# Patient Record
Sex: Male | Born: 1965 | Race: White | Hispanic: No | Marital: Single
Health system: Southern US, Community
[De-identification: ages and names within clinical notes are randomized; demographics above are authoritative.]

## PROBLEM LIST (undated history)

## (undated) DIAGNOSIS — R011 Cardiac murmur, unspecified: Secondary | ICD-10-CM

## (undated) DIAGNOSIS — B1920 Unspecified viral hepatitis C without hepatic coma: Secondary | ICD-10-CM

---

## 2017-06-14 ENCOUNTER — Encounter (HOSPITAL_COMMUNITY): Payer: Self-pay | Admitting: Emergency Medicine

## 2017-06-14 ENCOUNTER — Emergency Department (HOSPITAL_COMMUNITY)
Admission: EM | Admit: 2017-06-14 | Discharge: 2017-06-15 | Disposition: A | Discharge status: Home / Self Care | Payer: Self-pay | Attending: Emergency Medicine | Admitting: Emergency Medicine

## 2017-06-14 DIAGNOSIS — F1410 Cocaine abuse, uncomplicated: Secondary | ICD-10-CM | POA: Insufficient documentation

## 2017-06-14 DIAGNOSIS — R0789 Other chest pain: Secondary | ICD-10-CM | POA: Insufficient documentation

## 2017-06-14 HISTORY — DX: Cardiac murmur, unspecified: R01.1

## 2017-06-14 HISTORY — DX: Unspecified viral hepatitis C without hepatic coma: B19.20

## 2017-06-14 NOTE — ED Triage Notes (Signed)
Per EMS: Pt c/o of chronic CP.  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  Pt has admitted to having ETOH on board.  Pt c/o of back pain, dizziness, and nausea.  Pt CBG was 69, given of D10 and new CBG is 200.  Pt c/o of nausea and dizziness has subsided. Pt given 2 of nitro and has not helped CP.    VSS EKG A&Ox4

## 2017-06-14 NOTE — ED Provider Notes (Signed)
[facility]-EMERGENCY DEPT Provider Note   CSN: [id number] Arrival date & time: 06/14/17  2334  By signing my name below, I, [name], attest that this documentation has been prepared under the direction and in the presence of Leana Springston, Jeannett SeniorStephen, MD. Electronically Signed: [name], ED Scribe. 06/14/17. 11:54 PM.  LEVEL V CAVEAT: HPI and ROS limited due to ETOH intoxication.  History   Chief Complaint Chief Complaint  Patient presents with  . Chest Pain    HPI Burton ApleyJohnny Salas is a 51 y.o. male with a PMHx of seizures, heart murmur, ETOH abuse, who presents to the Emergency Department complaining of constant chest pain that started ~ 2 hours ago. Associated sx include SOB, chills and diaphoresis. Pt was in the car when onset started. Over exertion exacerbates his pain. He has had similar pain in the past. No hx of heart attacks. He drank 4 beers PTA. Pt is not currently taking any medication because he can't afford them. Pt denies dizziness.   Additionally he c/o of intermittent chronic back pain. Pain radiates down his right leg. His pain was exacerbated after moving a mattress.   The history is provided by the patient. The history is limited by the condition of the patient. No language interpreter was used.    Past Medical History:  Diagnosis Date  . Hepatitis C   . Murmur, cardiac     There are no active problems to display for this patient.   No past surgical history on file.     Home Medications    Prior to Admission medications   Not on File    Family History No family history on file.  Social History Social History  Substance Use Topics  . Smoking status: Not on file  . Smokeless tobacco: Not on file  . Alcohol use Not on file     Allergies   Patient has no allergy information on record.   Review of Systems Review of Systems  Unable to perform ROS: Other (ETOH intoxication)   Physical Exam Updated Vital Signs BP 121/78 (BP Location: Right  Arm)   Pulse 92   Temp 98.1 F (36.7 C) (Oral)   Resp 13   Ht 5\' 4"  (1.626 m)   Wt 138 lb (62.6 kg)   SpO2 95%   BMI 23.69 kg/m   Physical Exam  Constitutional: He is oriented to person, place, and time. He appears well-developed and well-nourished. No distress.  Disheveled appearance.  intoxicated  HENT:  Head: Normocephalic and atraumatic.  Mouth/Throat: Oropharynx is clear and moist. No oropharyngeal exudate.  Poor dentition.  Eyes: Pupils are equal, round, and reactive to light. Conjunctivae and EOM are normal.  Neck: Normal range of motion. Neck supple.  No meningismus.  Cardiovascular: Normal rate, regular rhythm, normal heart sounds and intact distal pulses.   No murmur heard. Equal femoral and DP pulses  Pulmonary/Chest: Effort normal and breath sounds normal. No respiratory distress.  Reproducible chest pain. Right paraspinal thoracic tenderness. No midline tenderness.   Abdominal: Soft. There is no tenderness. There is no rebound and no guarding.  Musculoskeletal: Normal range of motion. He exhibits no edema or tenderness.  5/5 strength in bilateral lower extremities. Ankle plantar and dorsiflexion intact. Great toe extension intact bilaterally. +2 DP and PT pulses. +2 patellar reflexes bilaterally. Normal gait.  Neurological: He is alert and oriented to person, place, and time. No cranial nerve deficit. He exhibits normal muscle tone. Coordination normal.   5/5 strength throughout. CN 2-12  intact.Equal grip strength.   Skin: Skin is warm.  Psychiatric: He has a normal mood and affect. His behavior is normal.  Nursing note and vitals reviewed.    ED Treatments / Results  DIAGNOSTIC STUDIES: Oxygen Saturation is 95% on RA, normal by my interpretation.   Labs (all labs ordered are listed, but only abnormal results are displayed) Labs Reviewed  CBC WITH DIFFERENTIAL/PLATELET - Abnormal; Notable for the following:       Result Value   Hemoglobin 17.1 (*)     Lymphs Abs 4.7 (*)    All other components within normal limits  COMPREHENSIVE METABOLIC PANEL - Abnormal; Notable for the following:    CO2 21 (*)    Calcium 8.4 (*)    AST 73 (*)    All other components within normal limits  ETHANOL - Abnormal; Notable for the following:    Alcohol, Ethyl (B) 286 (*)    All other components within normal limits  RAPID URINE DRUG SCREEN, HOSP PERFORMED - Abnormal; Notable for the following:    Cocaine POSITIVE (*)    Benzodiazepines POSITIVE (*)    All other components within normal limits  D-DIMER, QUANTITATIVE (NOT AT [facility]) - Abnormal; Notable for the following:    D-Dimer, Quant 0.95 (*)    All other components within normal limits  URINALYSIS, ROUTINE W REFLEX MICROSCOPIC - Abnormal; Notable for the following:    Color, Urine STRAW (*)    Specific Gravity, Urine 1.003 (*)    All other components within normal limits  TROPONIN I  TROPONIN I    EKG  EKG Interpretation  Date/Time:  Thursday June 14 2017 23:37:04 EDT Ventricular Rate:  88 PR Interval:    QRS Duration: 82 QT Interval:  372 QTC Calculation: 451 R Axis:   69 Text Interpretation:  Sinus rhythm No previous ECGs available Confirmed by Glynn Octave [phone]) on 06/14/2017 11:43:11 PM       Radiology Dg Chest 2 View  Result Date: 06/15/2017 CLINICAL DATA:  Chronic chest pain EXAM: CHEST  2 VIEW COMPARISON:  None. FINDINGS: The heart size and mediastinal contours are within normal limits. Both lungs are clear. The visualized skeletal structures are unremarkable. IMPRESSION: No active cardiopulmonary disease. Electronically Signed   By: Tollie Eth M.D.   On: 06/15/2017 01:16   Ct Angio Chest/abd/pel For Dissection W And/or Wo Contrast  Result Date: 06/15/2017 CLINICAL DATA:  51 year old male with left-sided chest pain radiating to the back. Concern for aortic dissection. EXAM: CT ANGIOGRAPHY CHEST, ABDOMEN AND PELVIS TECHNIQUE: Multidetector CT imaging through the chest,  abdomen and pelvis was performed using the standard protocol during bolus administration of intravenous contrast. Multiplanar reconstructed images and MIPs were obtained and reviewed to evaluate the vascular anatomy. CONTRAST:  100 cc Isovue 370 COMPARISON:  Chest radiograph dated 06/15/2017 FINDINGS: CTA CHEST FINDINGS Cardiovascular: There is no cardiomegaly or pericardial effusion. The thoracic aorta appears unremarkable. There is no aneurysmal dilatation or evidence of dissection. The origins of the great vessels of the aortic arch appear patent. There is no CT evidence of pulmonary embolism. Mediastinum/Nodes: No hilar or mediastinal adenopathy. The esophagus and the thyroid gland are grossly unremarkable. No mediastinal hematoma or fluid collection. Lungs/Pleura: Mild centrilobular emphysema. The lungs are clear. There is no pleural effusion or pneumothorax. There is a 4 mm right upper lobe subpleural nodule (series 7, image 84). The central airways are patent. Musculoskeletal: No chest wall abnormality. No acute or significant osseous findings. Review of the  MIP images confirms the above findings. CTA ABDOMEN AND PELVIS FINDINGS VASCULAR Aorta: Moderate atherosclerotic calcification. No aneurysmal dilatation or dissection. Mild ectasia of the infrarenal aorta measuring up to 2.1 cm in diameter. Celiac: There is focal narrowing of the origin of the celiac axis, likely secondary to compression by diaphragmatic crus. The celiac axis is otherwise patent. No aneurysm or dissection. SMA: Patent without evidence of aneurysm, dissection, vasculitis or significant stenosis. Renals: Both renal arteries are patent without evidence of aneurysm, dissection, vasculitis, fibromuscular dysplasia or significant stenosis. IMA: Patent without evidence of aneurysm, dissection, vasculitis or significant stenosis. Inflow: Moderate atherosclerotic calcification of the iliac arteries. No aneurysmal dilatation or dissection. Veins:  No obvious venous abnormality within the limitations of this arterial phase study. Review of the MIP images confirms the above findings. NON-VASCULAR No intra-abdominal free air or free fluid. Hepatobiliary: Mild diffuse fatty infiltration of the liver. There is slight irregularity of the hepatic contour concerning for early cirrhosis. Correlation with clinical exam and liver function tests recommended. Small amount of layering small stones within the gallbladder versus volume averaging artifact with adjacent vessel (series 6, image 186). There is no pericholecystic fluid or evidence of gallbladder inflammation by CT. Pancreas: Unremarkable. No pancreatic ductal dilatation or surrounding inflammatory changes. Spleen: Normal in size without focal abnormality. Adrenals/Urinary Tract: The adrenal glands, kidneys, and the visualized ureters appear unremarkable. The urinary bladder is distended and grossly unremarkable. Stomach/Bowel: Moderate amount of stool throughout the colon. No evidence of bowel obstruction or active inflammation. The visualized appendix or appendiceal stump appears unremarkable. No inflammatory changes in the right lower quadrant or pericecal region. Lymphatic: No adenopathy. Reproductive: The prostate and seminal vesicles are grossly unremarkable. Other: None Musculoskeletal: No acute or significant osseous findings. Review of the MIP images confirms the above findings. IMPRESSION: 1. No aortic aneurysm or dissection. No CT evidence of pulmonary artery embolus. 2.  Aortic Atherosclerosis (ICD10-I70.0). 3. 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 The celiac axis remains patent. 4. Mild centrilobular emphysema.  No focal consolidation. 5. A 4 mm right upper lobe subpleural nodule. No follow-up needed if patient is low-risk. Non-contrast chest CT can be considered in 12 months if patient is high-risk. This recommendation follows the  consensus statement: Guidelines for Management of Incidental Pulmonary Nodules Detected on CT Images: From the Fleischner Society 2017; Radiology 2017; 284:228-243. 6. Fatty liver. Electronically Signed   By: Elgie Collard M.D.   On: 06/15/2017 02:58    Procedures Procedures (including critical care time)  Medications Ordered in ED Medications - No data to display   Initial Impression / Assessment and Plan / ED Course  I have reviewed the triage vital signs and the nursing notes.  Pertinent labs & imaging results that were available during my care of the patient were reviewed by me and considered in my medical decision making (see chart for details).    Patient presents with left-sided chest pain onset a few hours ago while he was driving. Patient endorses drinking alcohol tonight. And has a history of hepatitis C. Denies any IV drug abuse.  EKG is normal sinus rhythm. There is no comparison. Troponin is negative. Chest pain is somewhat reproducible to palpation. Drug screen is positive for cocaine. Patient states he last used this 2 days ago. 3:13 AM Pt states he used cocaine a couple of days ago with a friend. He states he doesn't normally use cocaine.    CTA negative for  dissection or other acute pathology.  Patient states back pain is ongoing issue. No evidence of cord compression or cauda equina.  Troponin negative x2.  Chest pain atypical for ACS. Doubt cocaine induced vasospasm as last use was >48 hours ago.   Patient stable for outpatient followup. Alcohol and cocaine cessation discussed. Return precautions discussed. Final Clinical Impressions(s) / ED Diagnoses   Final diagnoses:  Atypical chest pain  Cocaine abuse    New Prescriptions New Prescriptions   No medications on file   I personally performed the services described in this documentation, which was scribed in my presence. The recorded information has been reviewed and is accurate.     Glynn Octave,  MD 06/15/17 2108

## 2017-06-15 ENCOUNTER — Encounter (HOSPITAL_COMMUNITY): Payer: Self-pay | Admitting: Radiology

## 2017-06-15 ENCOUNTER — Emergency Department (HOSPITAL_COMMUNITY): Payer: Self-pay

## 2017-06-15 LAB — CBC WITH DIFFERENTIAL/PLATELET
BASOS PCT: 0 %
Basophils Absolute: 0 10*3/uL (ref 0.0–0.1)
EOS ABS: 0.2 10*3/uL (ref 0.0–0.7)
Eosinophils Relative: 2 %
HEMATOCRIT: 49.7 % (ref 39.0–52.0)
Hemoglobin: 17.1 g/dL — ABNORMAL HIGH (ref 13.0–17.0)
Lymphocytes Relative: 53 %
Lymphs Abs: 4.7 10*3/uL — ABNORMAL HIGH (ref 0.7–4.0)
MCH: 32.9 pg (ref 26.0–34.0)
MCHC: 34.4 g/dL (ref 30.0–36.0)
MCV: 95.6 fL (ref 78.0–100.0)
MONO ABS: 0.5 10*3/uL (ref 0.1–1.0)
MONOS PCT: 5 %
Neutro Abs: 3.7 10*3/uL (ref 1.7–7.7)
Neutrophils Relative %: 40 %
Platelets: 157 10*3/uL (ref 150–400)
RBC: 5.2 MIL/uL (ref 4.22–5.81)
RDW: 14.7 % (ref 11.5–15.5)
WBC: 9.1 10*3/uL (ref 4.0–10.5)

## 2017-06-15 LAB — RAPID URINE DRUG SCREEN, HOSP PERFORMED
Amphetamines: NOT DETECTED
BARBITURATES: NOT DETECTED
BENZODIAZEPINES: POSITIVE — AB
COCAINE: POSITIVE — AB
OPIATES: NOT DETECTED
Tetrahydrocannabinol: NOT DETECTED

## 2017-06-15 LAB — COMPREHENSIVE METABOLIC PANEL
ALBUMIN: 3.9 g/dL (ref 3.5–5.0)
ALT: 41 U/L (ref 17–63)
ANION GAP: 13 (ref 5–15)
AST: 73 U/L — ABNORMAL HIGH (ref 15–41)
Alkaline Phosphatase: 71 U/L (ref 38–126)
BILIRUBIN TOTAL: 0.6 mg/dL (ref 0.3–1.2)
BUN: 7 mg/dL (ref 6–20)
CO2: 21 mmol/L — ABNORMAL LOW (ref 22–32)
Calcium: 8.4 mg/dL — ABNORMAL LOW (ref 8.9–10.3)
Chloride: 109 mmol/L (ref 101–111)
Creatinine, Ser: 0.83 mg/dL (ref 0.61–1.24)
GFR calc non Af Amer: >60 mL/min (ref >60)
GLUCOSE: 83 mg/dL (ref 65–99)
POTASSIUM: 4.1 mmol/L (ref 3.5–5.1)
SODIUM: 143 mmol/L (ref 135–145)
TOTAL PROTEIN: 8 g/dL (ref 6.5–8.1)

## 2017-06-15 LAB — TROPONIN I
Troponin I: <0.03 ng/mL (ref <0.03)
Troponin I: <0.03 ng/mL (ref <0.03)

## 2017-06-15 LAB — URINALYSIS, ROUTINE W REFLEX MICROSCOPIC
BILIRUBIN URINE: NEGATIVE
Glucose, UA: NEGATIVE mg/dL
Hgb urine dipstick: NEGATIVE
Ketones, ur: NEGATIVE mg/dL
LEUKOCYTES UA: NEGATIVE
NITRITE: NEGATIVE
PH: 5 (ref 5.0–8.0)
Protein, ur: NEGATIVE mg/dL
SPECIFIC GRAVITY, URINE: 1.003 — AB (ref 1.005–1.030)

## 2017-06-15 LAB — ETHANOL: Alcohol, Ethyl (B): 286 mg/dL — ABNORMAL HIGH (ref <5)

## 2017-06-15 LAB — D-DIMER, QUANTITATIVE: D-Dimer, Quant: 0.95 ug/mL-FEU — ABNORMAL HIGH (ref 0.00–0.50)

## 2017-06-15 MED ORDER — IOPAMIDOL (ISOVUE-370) INJECTION 76%
INTRAVENOUS | Status: AC
  Administered 2017-06-15: 100 mL
  Filled 2017-06-15: qty 100

## 2017-06-15 MED ORDER — KETOROLAC TROMETHAMINE 30 MG/ML IJ SOLN
30.0000 mg | Freq: Once | INTRAMUSCULAR | Status: AC
Start: 2017-06-15 — End: 2017-06-15
  Administered 2017-06-15: 30 mg via INTRAVENOUS
  Filled 2017-06-15: qty 1

## 2017-06-15 MED ORDER — LORAZEPAM 2 MG/ML IJ SOLN
1.0000 mg | Freq: Once | INTRAMUSCULAR | Status: AC
  Administered 2017-06-15: 1 mg via INTRAVENOUS
  Filled 2017-06-15: qty 1

## 2017-06-15 NOTE — ED Notes (Signed)
ED Provider at bedside. 

## 2017-06-15 NOTE — ED Notes (Signed)
Pt provided sandwich at discharge, stated he had not had anything to eat all night

## 2017-06-15 NOTE — Discharge Instructions (Signed)
There is no evidence of heart attack or blood clot in the lung. Stop using cocaine. Follow up with your doctor. Return to the ED if you develop new or worsening symptoms.

## 2018-03-29 IMAGING — DX DG CHEST 2V
2 series · 3 of 3 positions shown · non-contrast
Comparison: None.

CLINICAL DATA: Chronic chest pain

EXAM:
CHEST  2 VIEW

[Series 2: chest lat · 0.14mm/px · 2 of 2 slices shown]
[im 1/2]
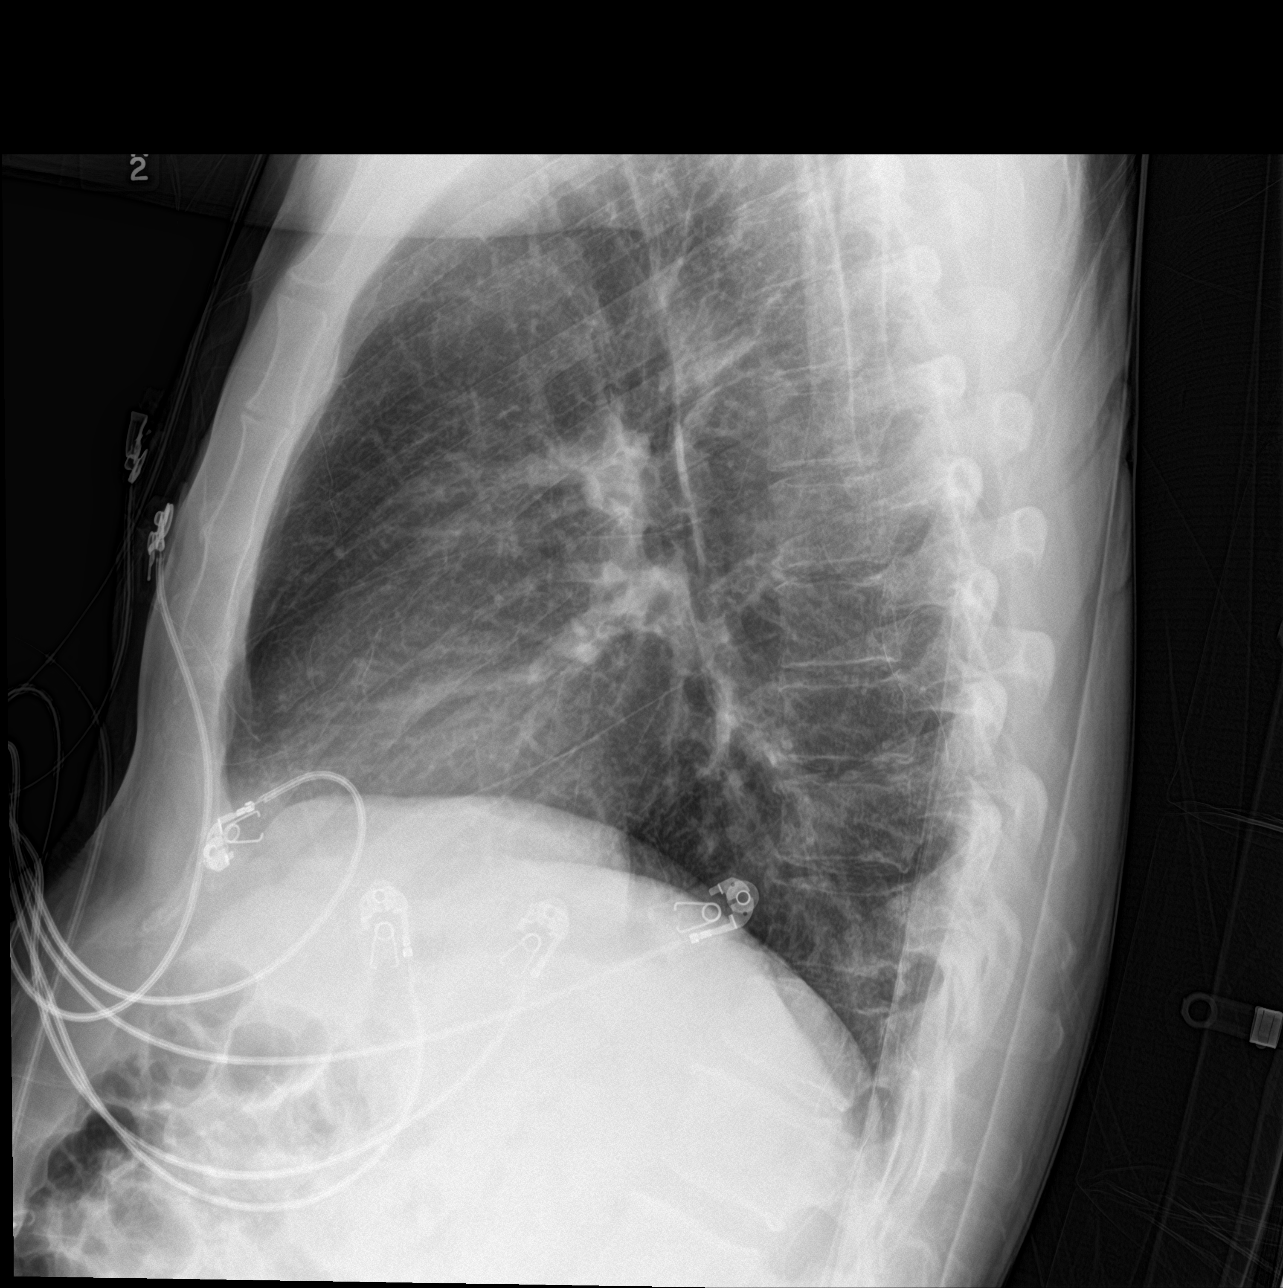
[im 2/2]
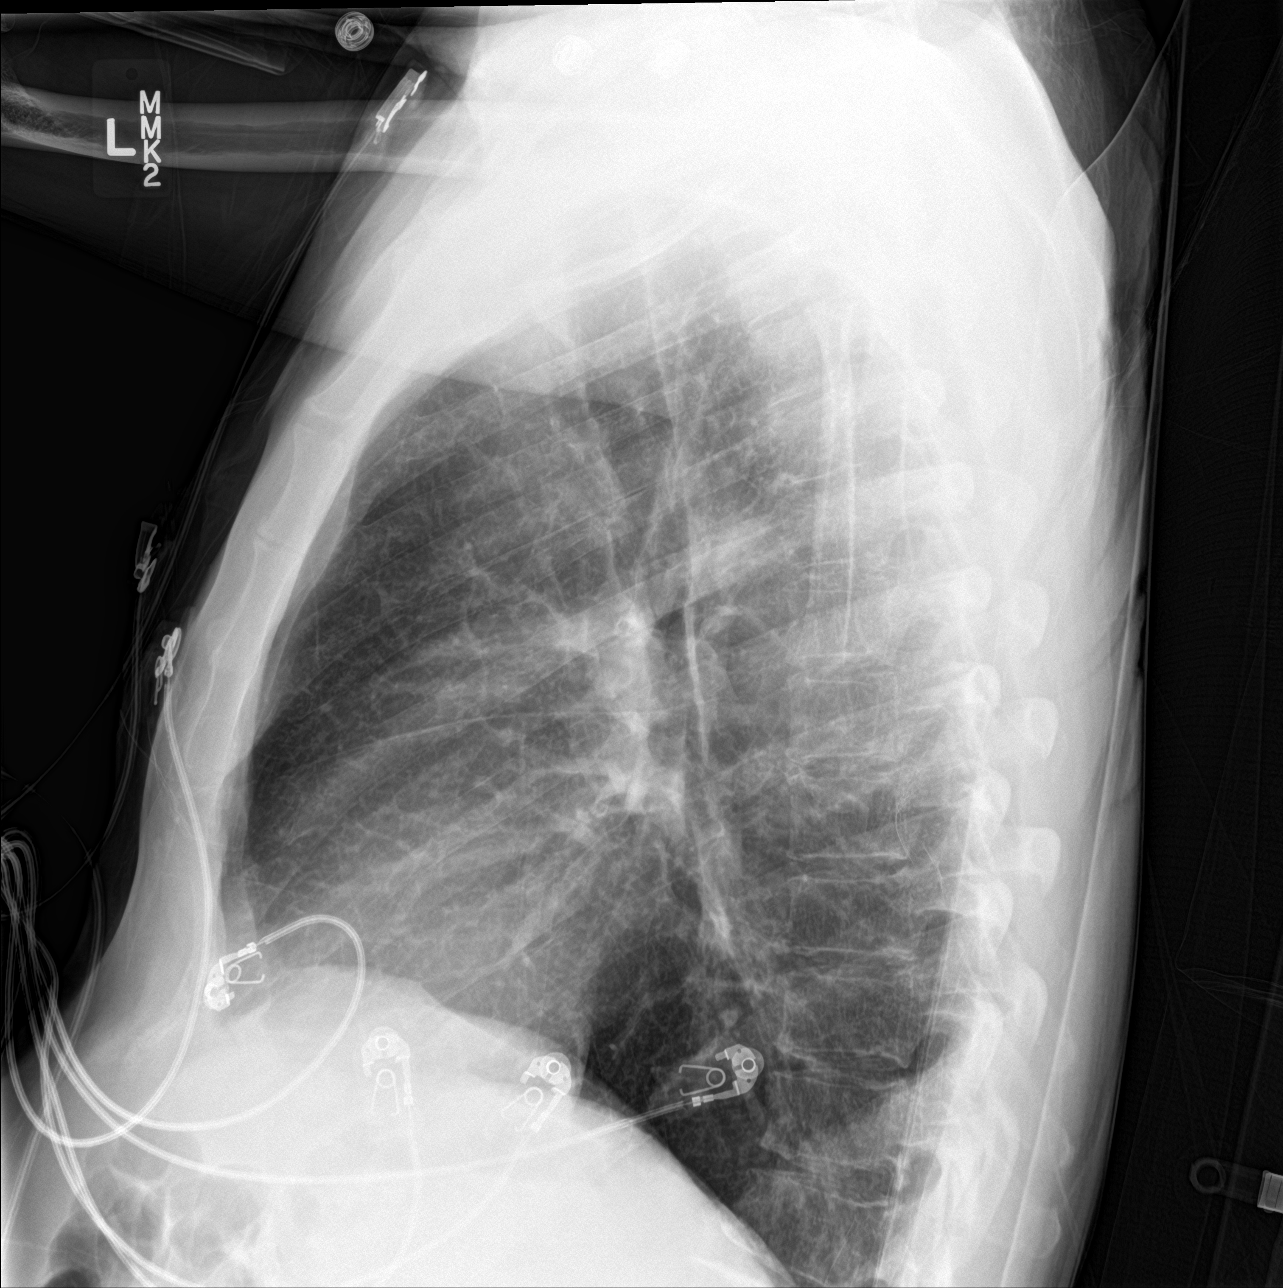

[chest ap]
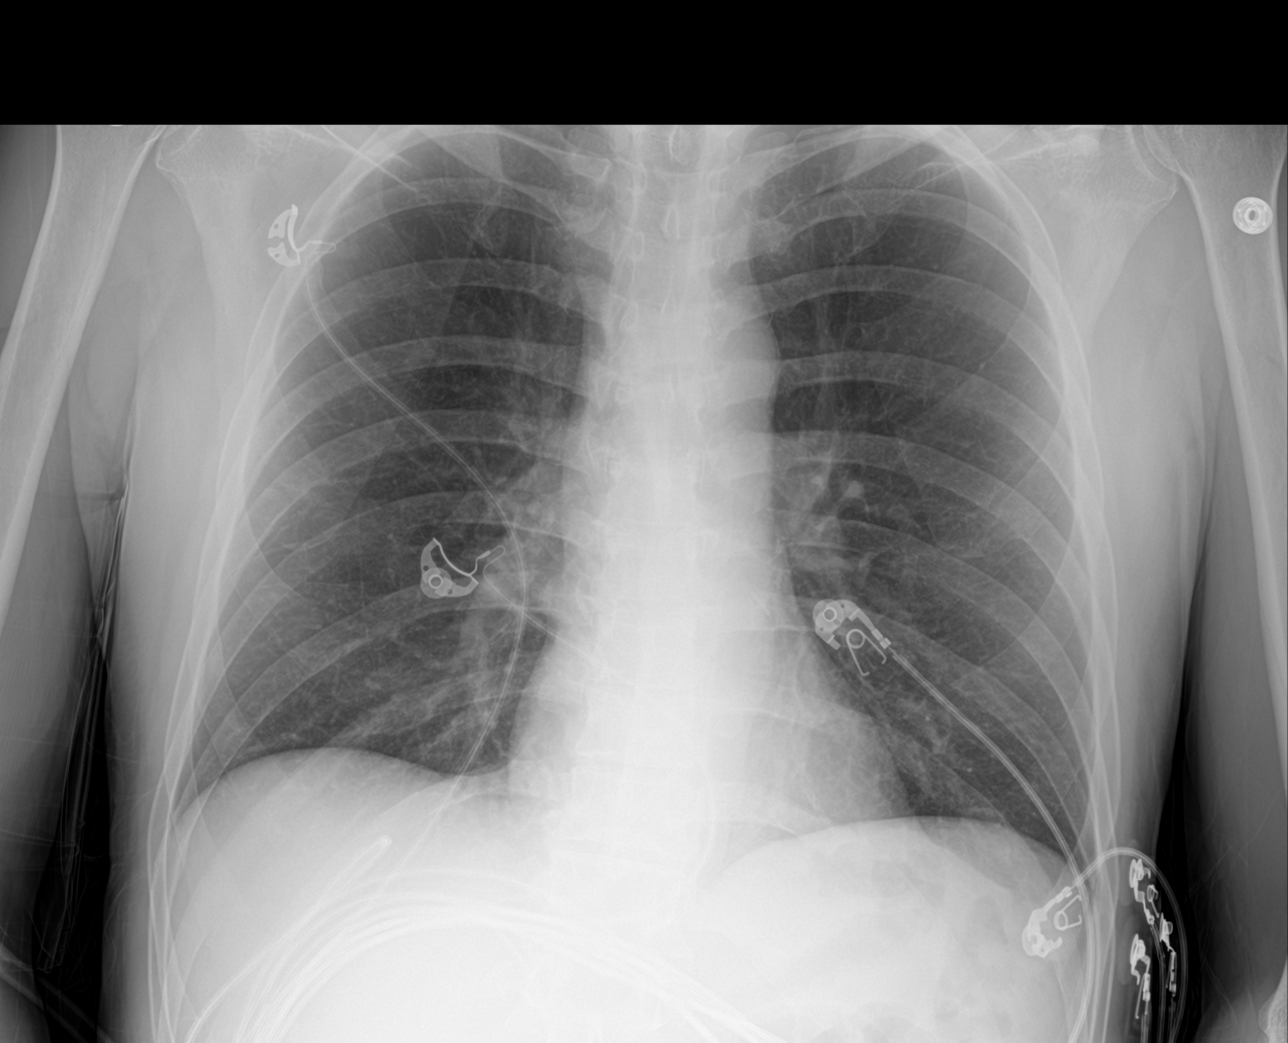

[3 of 3 positions shown; findings below may reference images not displayed]

FINDINGS: The heart size and mediastinal contours are within normal limits.
Both lungs are clear. The visualized skeletal structures are
unremarkable.
IMPRESSION: No active cardiopulmonary disease.

## 2018-03-29 IMAGING — CT CT ANGIO CHEST-ABD-PELV FOR DISSECTION W/ AND WO/W CM
2 of 7 series · 12 of 46 positions shown, 14 images · IV contrast (isovue)
Comparison: Chest radiograph dated 06/15/2017

CLINICAL DATA: 50-year-old male with left-sided chest pain
radiating to the back. Concern for aortic dissection.

EXAM:
CT ANGIOGRAPHY CHEST, ABDOMEN AND PELVIS
TECHNIQUE: Multidetector CT imaging through the chest, abdomen and pelvis was
performed using the standard protocol during bolus administration of
intravenous contrast. Multiplanar reconstructed images and MIPs were
obtained and reviewed to evaluate the vascular anatomy.
CONTRAST:  100 cc Isovue 370

[Series 6: arterial · axial · arterial · 0.69mm/px · z∈[+305,+913]mm · 9 of 342 slices shown, 11 images]
[im 19/342  soft-tissue]
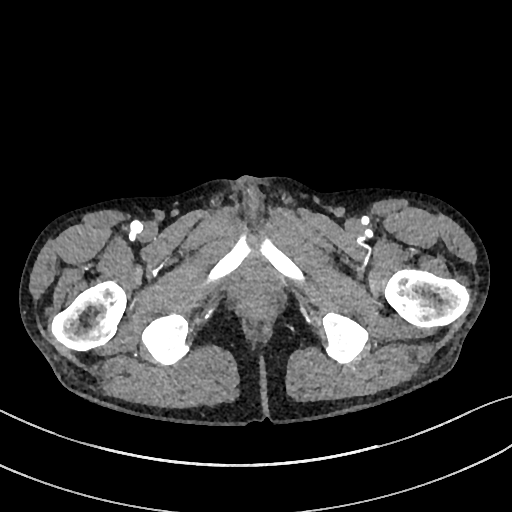
[im 19/342  bone]
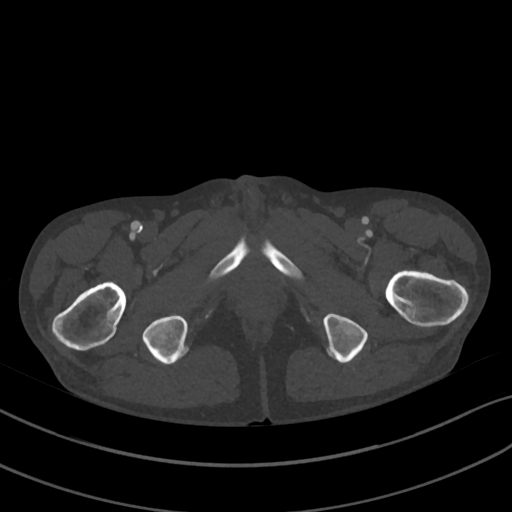
[im 57/342  soft-tissue]
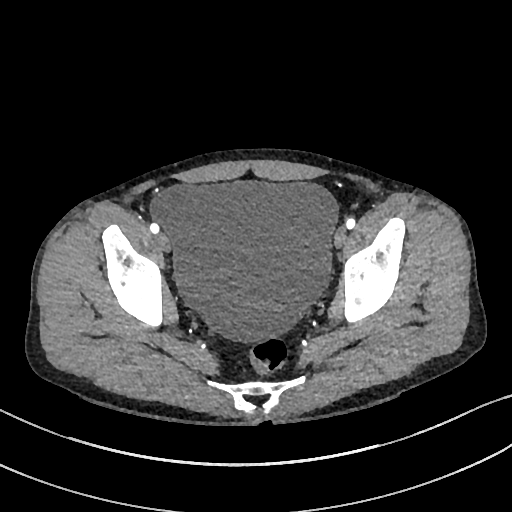
[im 95/342  soft-tissue]
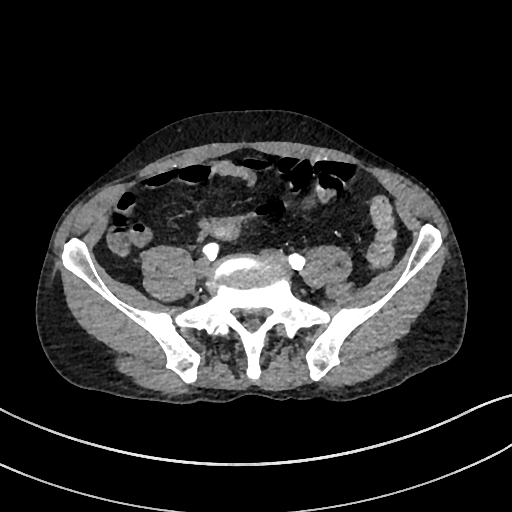
[im 133/342  soft-tissue]
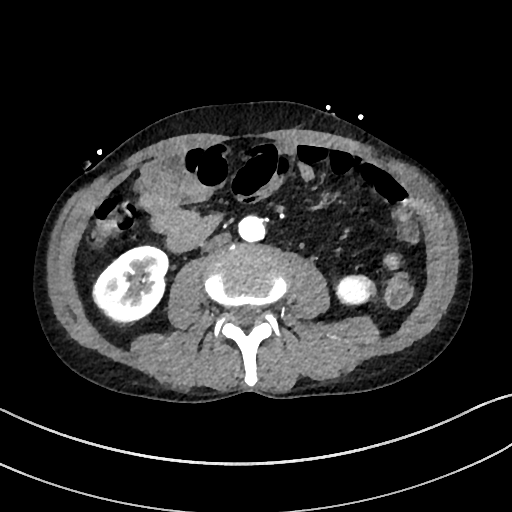
[im 171/342  soft-tissue]
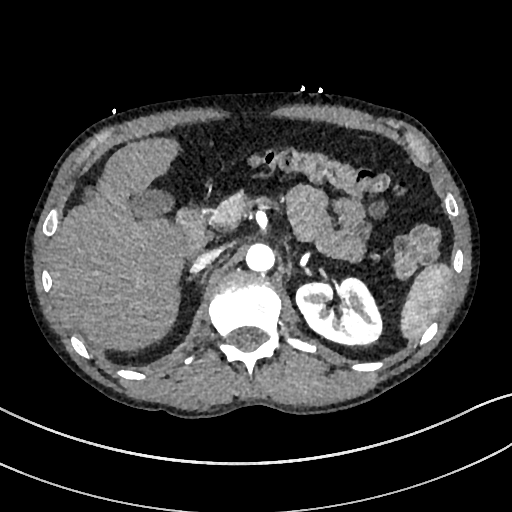
[im 209/342  soft-tissue]
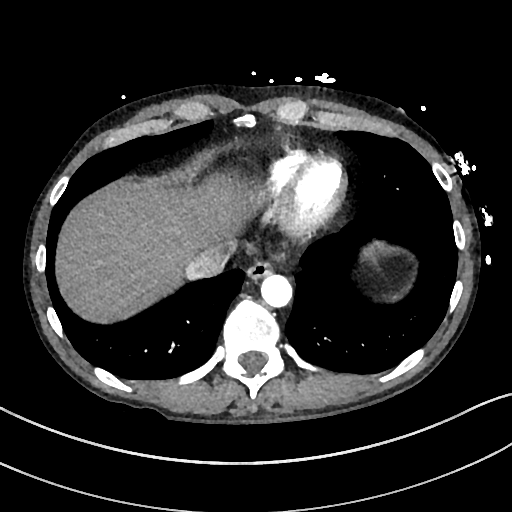
[im 247/342  soft-tissue]
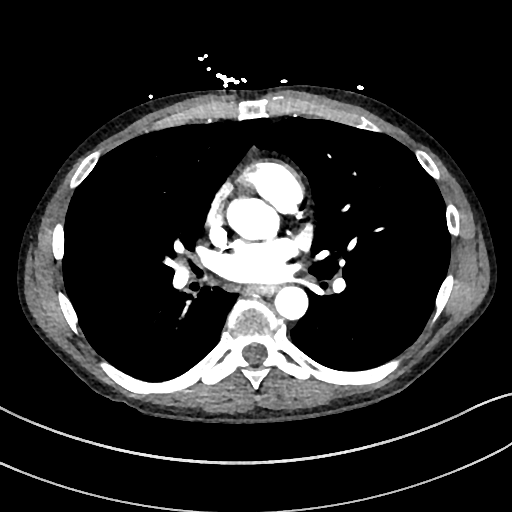
[im 285/342  soft-tissue]
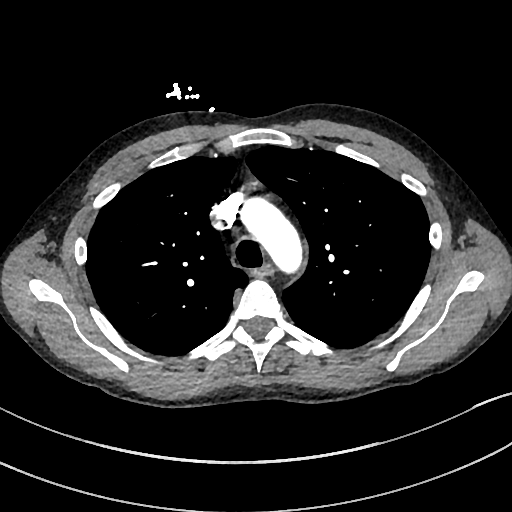
[im 323/342  soft-tissue]
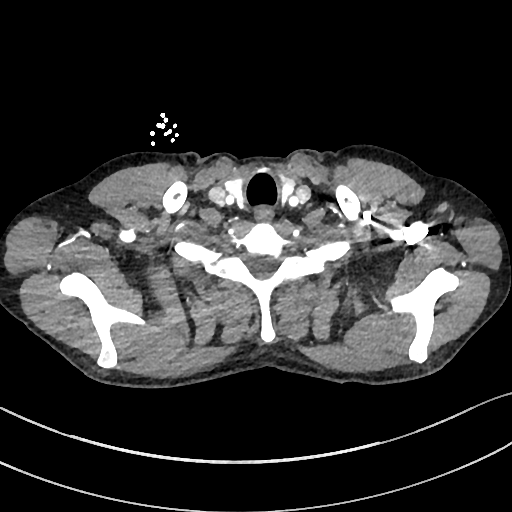
[im 323/342  bone]
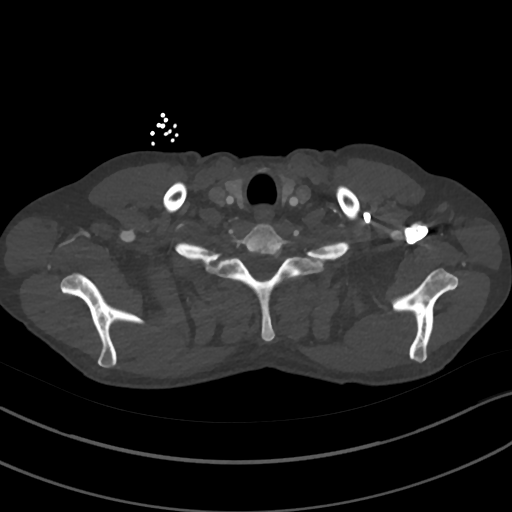

[Series 9: cor · coronal · 0.70mm/px · 3 of 109 slices shown]
[im 28/109  soft-tissue]
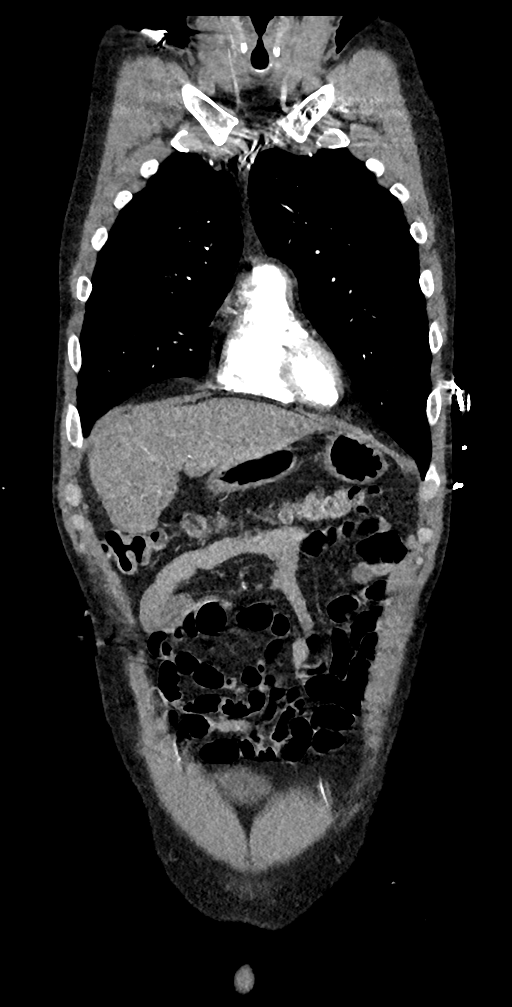
[im 55/109  soft-tissue]
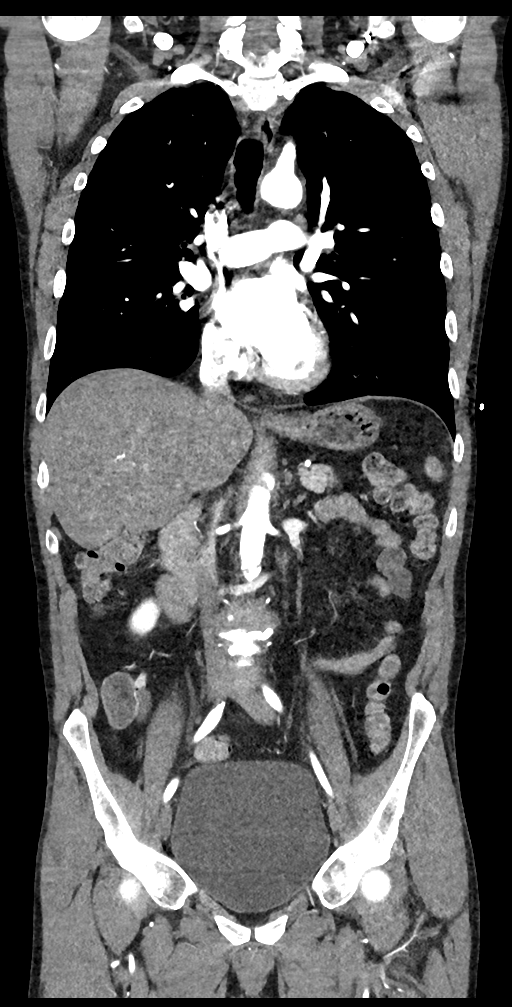
[im 82/109  soft-tissue]
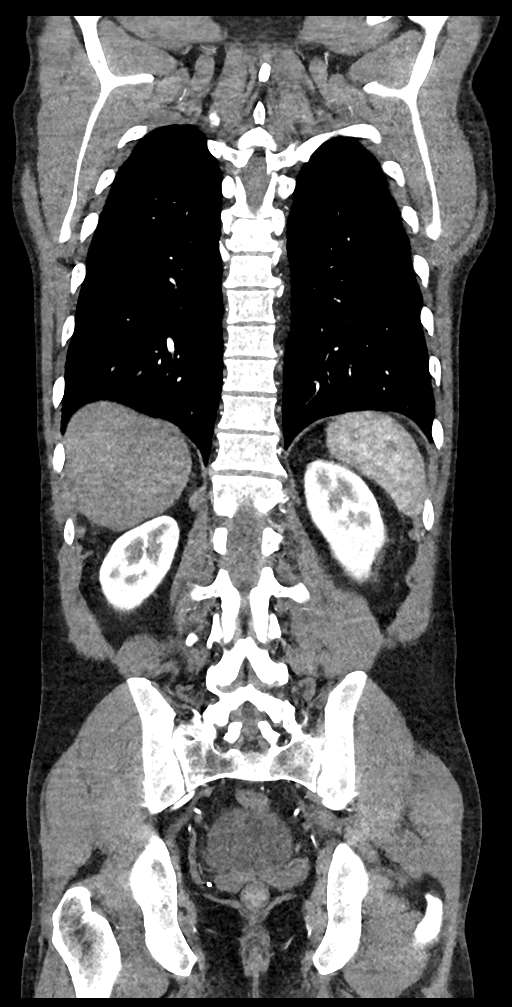

[12 of 46 positions shown; findings below may reference images not displayed]

FINDINGS: CTA CHEST FINDINGS

Cardiovascular: There is no cardiomegaly or pericardial effusion.
The thoracic aorta appears unremarkable. There is no aneurysmal
dilatation or evidence of dissection. The origins of the great
vessels of the aortic arch appear patent. There is no CT evidence of
pulmonary embolism.

Mediastinum/Nodes: No hilar or mediastinal adenopathy. The esophagus
and the thyroid gland are grossly unremarkable. No mediastinal
hematoma or fluid collection.

Lungs/Pleura: Mild centrilobular emphysema. The lungs are clear.
There is no pleural effusion or pneumothorax. There is a 4 mm right
upper lobe subpleural nodule (series 7, image 84). The central
airways are patent.

Musculoskeletal: No chest wall abnormality. No acute or significant
osseous findings.

Review of the MIP images confirms the above findings.

CTA ABDOMEN AND PELVIS FINDINGS

VASCULAR

Aorta: Moderate atherosclerotic calcification. No aneurysmal
dilatation or dissection. Mild ectasia of the infrarenal aorta
measuring up to 2.1 cm in diameter.

Celiac: There is focal narrowing of the origin of the celiac axis,
likely secondary to compression by diaphragmatic crus. The celiac
axis is otherwise patent. No aneurysm or dissection.

SMA: Patent without evidence of aneurysm, dissection, vasculitis or
significant stenosis.

Renals: Both renal arteries are patent without evidence of aneurysm,
dissection, vasculitis, fibromuscular dysplasia or significant
stenosis.

IMA: Patent without evidence of aneurysm, dissection, vasculitis or
significant stenosis.

Inflow: Moderate atherosclerotic calcification of the iliac
arteries. No aneurysmal dilatation or dissection.

Veins: No obvious venous abnormality within the limitations of this
arterial phase study.

Review of the MIP images confirms the above findings.

NON-VASCULAR

No intra-abdominal free air or free fluid.

Hepatobiliary: Mild diffuse fatty infiltration of the liver. There
is slight irregularity of the hepatic contour concerning for early
cirrhosis. Correlation with clinical exam and liver function tests
recommended. Small amount of layering small stones within the
gallbladder versus volume averaging artifact with adjacent vessel
(series 6, image 186). There is no pericholecystic fluid or evidence
of gallbladder inflammation by CT.

Pancreas: Unremarkable. No pancreatic ductal dilatation or
surrounding inflammatory changes.

Spleen: Normal in size without focal abnormality.

Adrenals/Urinary Tract: The adrenal glands, kidneys, and the
visualized ureters appear unremarkable. The urinary bladder is
distended and grossly unremarkable.

Stomach/Bowel: Moderate amount of stool throughout the colon. No
evidence of bowel obstruction or active inflammation. The visualized
appendix or appendiceal stump appears unremarkable. No inflammatory
changes in the right lower quadrant or pericecal region.

Lymphatic: No adenopathy.

Reproductive: The prostate and seminal vesicles are grossly
unremarkable.

Other: None

Musculoskeletal: No acute or significant osseous findings.

Review of the MIP images confirms the above findings.
IMPRESSION: 1. No aortic aneurysm or dissection. No CT evidence of pulmonary
artery embolus.
2.  Aortic Atherosclerosis ([id number]).
3. Mild-moderate stenosis of the origin of the celiac axis secondary
to compression by diaphragmatic crus. The celiac axis remains
patent.
4. Mild centrilobular emphysema.  No focal consolidation.
5. A 4 mm right upper lobe subpleural nodule. No follow-up needed if
patient is low-risk. Non-contrast chest CT can be considered in 12
months if patient is high-risk. This recommendation follows the
consensus statement: Guidelines for Management of Incidental
Pulmonary Nodules Detected on CT Images: From the [HOSPITAL]
6. Fatty liver.

## 2021-01-17 ENCOUNTER — Other Ambulatory Visit: Payer: Self-pay | Admitting: Critical Care Medicine
